# Patient Record
Sex: Female | Born: 1965 | Race: White | Hispanic: No | Marital: Married | Smoking: Never smoker
Health system: Southern US, Community
[De-identification: ages and names within clinical notes are randomized; demographics above are authoritative.]

## PROBLEM LIST (undated history)

## (undated) DIAGNOSIS — R55 Syncope and collapse: Secondary | ICD-10-CM

## (undated) DIAGNOSIS — I2510 Atherosclerotic heart disease of native coronary artery without angina pectoris: Secondary | ICD-10-CM

## (undated) DIAGNOSIS — R001 Bradycardia, unspecified: Secondary | ICD-10-CM

## (undated) DIAGNOSIS — H9319 Tinnitus, unspecified ear: Secondary | ICD-10-CM

## (undated) HISTORY — DX: Bradycardia, unspecified: R00.1

## (undated) HISTORY — DX: Syncope and collapse: R55

## (undated) HISTORY — DX: Tinnitus, unspecified ear: H93.19

---

## 2004-05-18 ENCOUNTER — Other Ambulatory Visit: Admission: RE | Admit: 2004-05-18 | Discharge: 2004-05-18 | Payer: Self-pay | Admitting: Obstetrics and Gynecology

## 2004-09-21 ENCOUNTER — Ambulatory Visit (HOSPITAL_COMMUNITY): Admission: RE | Admit: 2004-09-21 | Discharge: 2004-09-21 | Payer: Self-pay | Admitting: Obstetrics and Gynecology

## 2011-03-26 ENCOUNTER — Encounter (HOSPITAL_BASED_OUTPATIENT_CLINIC_OR_DEPARTMENT_OTHER): Payer: Self-pay | Admitting: *Deleted

## 2011-03-26 ENCOUNTER — Emergency Department (HOSPITAL_BASED_OUTPATIENT_CLINIC_OR_DEPARTMENT_OTHER)
Admission: EM | Admit: 2011-03-26 | Discharge: 2011-03-26 | Disposition: A | Discharge status: Home / Self Care | Payer: PRIVATE HEALTH INSURANCE | Attending: Emergency Medicine | Admitting: Emergency Medicine

## 2011-03-26 DIAGNOSIS — M5430 Sciatica, unspecified side: Secondary | ICD-10-CM | POA: Insufficient documentation

## 2011-03-26 DIAGNOSIS — IMO0001 Reserved for inherently not codable concepts without codable children: Secondary | ICD-10-CM | POA: Insufficient documentation

## 2011-03-26 MED ORDER — KETOROLAC TROMETHAMINE 30 MG/ML IJ SOLN
30.0000 mg | Freq: Once | INTRAMUSCULAR | Status: AC
  Administered 2011-03-26: 30 mg via INTRAVENOUS
  Filled 2011-03-26: qty 1

## 2011-03-26 MED ORDER — DIAZEPAM 5 MG PO TABS
5.0000 mg | ORAL_TABLET | Freq: Once | ORAL | Status: AC
  Administered 2011-03-26: 5 mg via ORAL
  Filled 2011-03-26: qty 1

## 2011-03-26 MED ORDER — DIAZEPAM 5 MG PO TABS: 5.0000 mg | ORAL_TABLET | Freq: Two times a day (BID) | ORAL | Status: AC

## 2011-03-26 MED ORDER — METHYLPREDNISOLONE 4 MG PO KIT: PACK | ORAL | Status: AC

## 2011-03-26 MED ORDER — OXYCODONE-ACETAMINOPHEN 5-325 MG PO TABS: 2.0000 | ORAL_TABLET | ORAL | Status: AC | PRN

## 2011-03-26 MED ORDER — IBUPROFEN 800 MG PO TABS: 800.0000 mg | ORAL_TABLET | Freq: Three times a day (TID) | ORAL | Status: AC

## 2011-03-26 MED ORDER — HYDROMORPHONE HCL PF 2 MG/ML IJ SOLN
2.0000 mg | Freq: Once | INTRAMUSCULAR | Status: AC
  Administered 2011-03-26: 2 mg via INTRAMUSCULAR
  Filled 2011-03-26: qty 1

## 2011-03-26 NOTE — ED Provider Notes (Signed)
History     CSN: [id number]  Arrival date & time 03/26/11  4098   First MD Initiated Contact with Patient 03/26/11 0745      Chief Complaint  Patient presents with  . Back Pain    (Consider location/radiation/quality/duration/timing/severity/associated sxs/prior treatment) HPI Comments: Sudden onset R buttock pain 2 days ago while twisting from sitting position.  Pain radiates down R leg to knee.  Difficulty walking today.  Taking naproxen with mild relief.  No previous back problems.  No bowel or bladder incontinence.  No fever, vomiting, weakness, numbness.  No history of cancer.  Patient is a 46 y.o. female presenting with back pain. The history is provided by the patient.  Back Pain  This is a new problem. The current episode started more than 2 days ago. The problem occurs constantly. The problem has not changed since onset.The pain is associated with twisting. The pain is present in the sacro-iliac joint and gluteal region. The quality of the pain is described as shooting and aching. The pain radiates to the right thigh and right knee. The pain is moderate. The symptoms are aggravated by bending, twisting and certain positions. Associated symptoms include numbness and tingling. Pertinent negatives include no chest pain, no fever, no headaches, no abdominal pain, no bowel incontinence, no perianal numbness, no bladder incontinence, no dysuria and no weakness. She has tried NSAIDs and heat for the symptoms. The treatment provided mild relief.    History reviewed. No pertinent past medical history.  History reviewed. No pertinent past surgical history.  History reviewed. No pertinent family history.  History  Substance Use Topics  . Smoking status: Never Smoker   . Smokeless tobacco: Not on file  . Alcohol Use:     OB History    Grav Para Term Preterm Abortions TAB SAB Ect Mult Living                  Review of Systems  Constitutional: Positive for activity change.  Negative for fever.  HENT: Negative for congestion and rhinorrhea.   Respiratory: Negative for cough and shortness of breath.   Cardiovascular: Negative for chest pain.  Gastrointestinal: Negative for nausea, vomiting, abdominal pain and bowel incontinence.  Genitourinary: Negative for bladder incontinence, dysuria, hematuria, vaginal bleeding and vaginal discharge.  Musculoskeletal: Positive for back pain and gait problem.  Skin: Negative for pallor.  Neurological: Positive for tingling and numbness. Negative for dizziness, weakness and headaches.    Allergies  Review of patient's allergies indicates no known allergies.  Home Medications  No current outpatient prescriptions on file.  BP 107/79  Pulse 91  Temp(Src) 97.6 F (36.4 C) (Oral)  Resp 20  Ht 5\' 4"  (1.626 m)  Wt 150 lb (68.04 kg)  BMI 25.75 kg/m2  SpO2 100%  LMP 03/17/2011  Physical Exam  Constitutional: She is oriented to person, place, and time. She appears well-developed and well-nourished. No distress.  HENT:  Head: Normocephalic and atraumatic.  Mouth/Throat: Oropharynx is clear and moist. No oropharyngeal exudate.  Eyes: Conjunctivae and EOM are normal. Pupils are equal, round, and reactive to light.  Neck: Normal range of motion. Neck supple.  Cardiovascular: Normal rate, regular rhythm and normal heart sounds.   Pulmonary/Chest: Effort normal and breath sounds normal. No respiratory distress.  Abdominal: Soft. There is no tenderness. There is no rebound and no guarding.  Musculoskeletal: Normal range of motion.       5/5 strength in bilateral LE.  Ankle plantar and dorsiflexion intact.  Able to extend great toes bilaterally.  +2 DP and PT pulses.  +2 patellar reflexes bilaterally.  +SLR bilaterally  Neurological: She is alert and oriented to person, place, and time. No cranial nerve deficit.  Skin: Skin is warm.    ED Course  Procedures (including critical care time)  Labs Reviewed - No data to  display No results found.   No diagnosis found.    MDM  Low back pain with radiation into R leg, consistent with sciatica. Neurologically intact, no red flags. Pain control, outpatient followup.   Pain controlled.  Patient able to ambulate in the department.      Glynn Octave, MD 03/26/11 [phone]

## 2011-03-26 NOTE — ED Notes (Signed)
Pt to room 8 by ems via stretcher. Pt moved from stretcher to bed x 3 assist. Pt reports "moved wrong way" Saturday, and has had low back right sided hip and leg pain since then.

## 2011-03-26 NOTE — ED Notes (Signed)
Pt amb to bathroom with slow, steady gait in nad. Cg@ provided by this rn and husband.

## 2011-03-26 NOTE — Discharge Instructions (Signed)
Back Exercises Back exercises help treat and prevent back injuries. The goal is to increase your strength in your belly (abdominal) and back muscles. These exercises can also help with flexibility. Start these exercises when told by your doctor. HOME CARE Back exercises include: Pelvic Tilt.  Lie on your back with your knees bent. Tilt your pelvis until the lower part of your back is against the floor. Hold this position 5 to 10 sec. Repeat this exercise 5 to 10 times.  Knee to Chest.  Pull 1 knee up against your chest and hold for 20 to 30 seconds. Repeat this with the other knee. This may be done with the other leg straight or bent, whichever feels better. Then, pull both knees up against your chest.  Sit-Ups or Curl-Ups.  Bend your knees 90 degrees. Start with tilting your pelvis, and do a partial, slow sit-up. Only lift your upper half 30 to 45 degrees off the floor. Take at least 2 to 3 seonds for each sit-up. Do not do sit-ups with your knees out straight. If partial sit-ups are difficult, simply do the above but with only tightening your belly (abdominal) muscles and holding it as told.  Hip-Lift.  Lie on your back with your knees flexed 90 degrees. Push down with your feet and shoulders as you raise your hips 2 inches off the floor. Hold for 10 seconds, repeat 5 to 10 times.  Back Arches.  Lie on your stomach. Prop yourself up on bent elbows. Slowly press on your hands, causing an arch in your low back. Repeat 3 to 5 times.  Shoulder-Lifts.  Lie face down with arms beside your body. Keep hips and belly pressed to floor as you slowly lift your head and shoulders off the floor.  Do not overdo your exercises. Be careful in the beginning. Exercises may cause you some mild back discomfort. If the pain lasts for more than 15 minutes, stop the exercises until you see your doctor. Improvement with exercise for back problems is slow.  Document Released: 02/17/2010 Document Revised: 09/27/2010  Document Reviewed: 02/17/2010 [employer] Patient Information 2012 [employer].Sciatica Sciatica is a weakness and/or changes in sensation (tingling, jolts, hot and cold, numbness) along the path the sciatic nerve travels. Irritation or damage to lumbar nerve roots is often also referred to as lumbar radiculopathy.  Lumbar radiculopathy (Sciatica) is the most common form of this problem. Radiculopathy can occur in any of the nerves coming out of the spinal cord. The problems caused depend on which nerves are involved. The sciatic nerve is the large nerve supplying the branches of nerves going from the hip to the toes. It often causes a numbness or weakness in the skin and/or muscles that the sciatic nerve serves. It also may cause symptoms (problems) of pain, burning, tingling, or electric shock-like feelings in the path of this nerve. This usually comes from injury to the fibers that make up the sciatic nerve. Some of these symptoms are low back pain and/or unpleasant feelings in the following areas:  From the mid-buttock down the back of the leg to the back of the knee.   And/or the outside of the calf and top of the foot.   And/or behind the inner ankle to the sole of the foot.  CAUSES   Herniated or slipped disc. Discs are the little cushions between the bones in the back.   Pressure by the piriformis muscle in the buttock on the sciatic nerve (Piriformis Syndrome).   Misalignment   of the bones in the lower back and buttocks (Sacroiliac Joint Derangement).   Narrowing of the spinal canal that puts pressure on or pinches the fibers that make up the sciatic nerve.   A slipped vertebra that is out of line with those above or beneath it.   Abnormality of the nervous system itself so that nerve fibers do not transmit signals properly, especially to feet and calves (neuropathy).   Tumor (this is rare).  Your caregiver can usually determine the cause of your sciatica and begin the treatment  most likely to help you. TREATMENT  Taking over-the-counter painkillers, physical therapy, rest, exercise, spinal manipulation, and injections of anesthetics and/or steroids may be used. Surgery, acupuncture, and Yoga can also be effective. Mind over matter techniques, mental imagery, and changing factors such as your bed, chair, desk height, posture, and activities are other treatments that may be helpful. You and your caregiver can help determine what is best for you. With proper diagnosis, the cause of most sciatica can be identified and removed. Communication and cooperation between your caregiver and you is essential. If you are not successful immediately, do not be discouraged. With time, a proper treatment can be found that will make you comfortable. HOME CARE INSTRUCTIONS   If the pain is coming from a problem in the back, applying ice to that area for 15 to 20 minutes, 3 to 4 times per day while awake, may be helpful. Put the ice in a plastic bag. Place a towel between the bag of ice and your skin.   You may exercise or perform your usual activities if these do not aggravate your pain, or as suggested by your caregiver.   Only take over-the-counter or prescription medicines for pain, discomfort, or fever as directed by your caregiver.   If your caregiver has given you a follow-up appointment, it is very important to keep that appointment. Not keeping the appointment could result in a chronic or permanent injury, pain, and disability. If there is any problem keeping the appointment, you must call back to this facility for assistance.  SEEK IMMEDIATE MEDICAL CARE IF:   You experience loss of control of bowel or bladder.   You have increasing weakness in the trunk, buttocks, or legs.   There is numbness in any areas from the hip down to the toes.   You have difficulty walking or keeping your balance.   You have any of the above, with fever or forceful vomiting.  Document Released:  01/09/2001 Document Revised: 09/27/2010 Document Reviewed: 08/29/2007 [employer] Patient Information 2012 [employer]. 

## 2012-06-09 ENCOUNTER — Other Ambulatory Visit (HOSPITAL_COMMUNITY): Payer: Self-pay | Admitting: Obstetrics

## 2012-06-09 DIAGNOSIS — Z1231 Encounter for screening mammogram for malignant neoplasm of breast: Secondary | ICD-10-CM

## 2012-06-19 ENCOUNTER — Ambulatory Visit (HOSPITAL_COMMUNITY): Payer: PRIVATE HEALTH INSURANCE

## 2012-06-26 ENCOUNTER — Ambulatory Visit (HOSPITAL_COMMUNITY)
Admission: RE | Admit: 2012-06-26 | Discharge: 2012-06-26 | Disposition: A | Discharge status: Home / Self Care | Payer: Commercial Managed Care - PPO | Source: Ambulatory Visit | Attending: Obstetrics | Admitting: Obstetrics

## 2012-06-26 DIAGNOSIS — Z1231 Encounter for screening mammogram for malignant neoplasm of breast: Secondary | ICD-10-CM | POA: Insufficient documentation

## 2012-08-26 ENCOUNTER — Emergency Department (HOSPITAL_COMMUNITY): Payer: Commercial Managed Care - PPO

## 2012-08-26 ENCOUNTER — Encounter (HOSPITAL_COMMUNITY): Payer: Self-pay | Admitting: Emergency Medicine

## 2012-08-26 ENCOUNTER — Emergency Department (HOSPITAL_COMMUNITY)
Admission: EM | Admit: 2012-08-26 | Discharge: 2012-08-26 | Disposition: A | Discharge status: Home / Self Care | Payer: Commercial Managed Care - PPO | Attending: Emergency Medicine | Admitting: Emergency Medicine

## 2012-08-26 DIAGNOSIS — R51 Headache: Secondary | ICD-10-CM | POA: Insufficient documentation

## 2012-08-26 DIAGNOSIS — I2510 Atherosclerotic heart disease of native coronary artery without angina pectoris: Secondary | ICD-10-CM | POA: Insufficient documentation

## 2012-08-26 DIAGNOSIS — R1110 Vomiting, unspecified: Secondary | ICD-10-CM | POA: Insufficient documentation

## 2012-08-26 DIAGNOSIS — R209 Unspecified disturbances of skin sensation: Secondary | ICD-10-CM | POA: Insufficient documentation

## 2012-08-26 DIAGNOSIS — R0789 Other chest pain: Secondary | ICD-10-CM

## 2012-08-26 DIAGNOSIS — R42 Dizziness and giddiness: Secondary | ICD-10-CM | POA: Insufficient documentation

## 2012-08-26 HISTORY — DX: Atherosclerotic heart disease of native coronary artery without angina pectoris: I25.10

## 2012-08-26 LAB — COMPREHENSIVE METABOLIC PANEL
Albumin: 4.1 g/dL (ref 3.5–5.2)
Alkaline Phosphatase: 88 U/L (ref 39–117)
BUN: 15 mg/dL (ref 6–23)
CO2: 23 mEq/L (ref 19–32)
Chloride: 101 mEq/L (ref 96–112)
Creatinine, Ser: 0.81 mg/dL (ref 0.50–1.10)
GFR calc non Af Amer: 86 mL/min — ABNORMAL LOW (ref >90)
Glucose, Bld: 89 mg/dL (ref 70–99)
Potassium: 3.8 mEq/L (ref 3.5–5.1)
Total Bilirubin: 0.5 mg/dL (ref 0.3–1.2)

## 2012-08-26 LAB — CBC WITH DIFFERENTIAL/PLATELET
Basophils Absolute: 0 10*3/uL (ref 0.0–0.1)
Eosinophils Absolute: 0 10*3/uL (ref 0.0–0.7)
Eosinophils Relative: 0 % (ref 0–5)
Lymphocytes Relative: 30 % (ref 12–46)
MCV: 87.5 fL (ref 78.0–100.0)
Neutrophils Relative %: 64 % (ref 43–77)
Platelets: 247 10*3/uL (ref 150–400)
RDW: 12.5 % (ref 11.5–15.5)
WBC: 7.8 10*3/uL (ref 4.0–10.5)

## 2012-08-26 LAB — POCT I-STAT TROPONIN I: Troponin i, poc: 0 ng/mL (ref 0.00–0.08)

## 2012-08-26 NOTE — ED Notes (Signed)
Cp started at 8 am tingling inleft at 9 am dull and pain increasing w/ h/a andsob nausea has hx neurocardiagenic syncopy has been having dizzy spell thought it was stress

## 2012-08-26 NOTE — ED Provider Notes (Addendum)
CSN: [id number]     Arrival date & time 08/26/12  1209 History     First MD Initiated Contact with Patient 08/26/12 1320     Chief Complaint  Patient presents with  . Chest Pain   (Consider location/radiation/quality/duration/timing/severity/associated sxs/prior Treatment) HPI Patient awakened at 6 AM today with anterior chest pain L. in nature, constant, with tingling in left arm. And headache. Accompanied by vomiting 1 episode. And lightheadedness No treatment prior to coming here. Discomfort is minimal at present. Nothing makes symptoms better or worse. She's had chest pain multiple times in the past but "never this bad" Past Medical History  Diagnosis Date  . Coronary artery disease     neurocardiogenic syncopy   no known history of coronary artery disease patient has no history of catheterization. Had stress test 12 years ago and suffered syncope during stress test. Cardiac risk factors father determined to have coronary artery disease in his 79s. No family history of early MI other risk factors negative No past surgical history on file. No family history on file. History  Substance Use Topics  . Smoking status: Never Smoker   . Smokeless tobacco: Not on file  . Alcohol Use: Yes   no illicit drug use OB History   Grav Para Term Preterm Abortions TAB SAB Ect Mult Living                 Review of Systems  Constitutional: Negative.   Respiratory: Negative.   Cardiovascular: Positive for chest pain.  Gastrointestinal: Negative.   Musculoskeletal: Negative.   Skin: Negative.   Neurological: Positive for light-headedness and headaches.  Psychiatric/Behavioral: Negative.   All other systems reviewed and are negative.    Allergies  Review of patient's allergies indicates no known allergies.  Home Medications  No current outpatient prescriptions on file. BP 138/84  Pulse 80  Temp(Src) 97.7 F (36.5 C)  Resp 20  SpO2 100%  LMP 08/12/2012 Physical Exam  Nursing note  and vitals reviewed. Constitutional: She appears well-developed and well-nourished.  HENT:  Head: Normocephalic and atraumatic.  Eyes: Conjunctivae are normal. Pupils are equal, round, and reactive to light.  Neck: Neck supple. No tracheal deviation present. No thyromegaly present.  Cardiovascular: Normal rate and regular rhythm.   No murmur heard. Pulmonary/Chest: Effort normal and breath sounds normal.  Abdominal: Soft. Bowel sounds are normal. She exhibits no distension. There is no tenderness.  Musculoskeletal: Normal range of motion. She exhibits no edema and no tenderness.  Neurological: She is alert. Coordination normal.  Skin: Skin is warm and dry. No rash noted.  Psychiatric: She has a normal mood and affect.    ED Course   Procedures (including critical care time)  Labs Reviewed  COMPREHENSIVE METABOLIC PANEL - Abnormal; Notable for the following:    GFR calc non Af Amer 86 (*)    All other components within normal limits  CBC WITH DIFFERENTIAL - Abnormal; Notable for the following:    RBC 5.36 (*)    Hemoglobin 16.0 (*)    HCT 46.9 (*)    All other components within normal limits  URINALYSIS, ROUTINE W REFLEX MICROSCOPIC  POCT I-STAT TROPONIN I   Dg Chest 2 View  08/26/2012   *RADIOLOGY REPORT*  Clinical Data: Chest pain, headache, difficulty breathing  CHEST - 2 VIEW  Comparison: None.  Findings: Cardiomediastinal silhouette is unremarkable.  No acute infiltrate or pleural effusion.  No pulmonary edema.  Bony thorax is unremarkable.  IMPRESSION: No active disease.  Original Report Authenticated By: Natasha Mead, M.D.   No diagnosis found.  Date: 08/26/2012  Rate: 70  Rhythm: normal sinus rhythm  QRS Axis: normal  Intervals: normal  ST/T Wave abnormalities: normal  Conduction Disutrbances:Rightward IVCD  Narrative Interpretation:   Old EKG Reviewed: none available Chest x-ray viewed by me Results for orders placed during the hospital encounter of 08/26/12   COMPREHENSIVE METABOLIC PANEL      Result Value Range   Sodium 138  135 - 145 mEq/L   Potassium 3.8  3.5 - 5.1 mEq/L   Chloride 101  96 - 112 mEq/L   CO2 23  19 - 32 mEq/L   Glucose, Bld 89  70 - 99 mg/dL   BUN 15  6 - 23 mg/dL   Creatinine, Ser 7.82  0.50 - 1.10 mg/dL   Calcium 95.6  8.4 - 21.3 mg/dL   Total Protein 7.2  6.0 - 8.3 g/dL   Albumin 4.1  3.5 - 5.2 g/dL   AST 22  0 - 37 U/L   ALT 14  0 - 35 U/L   Alkaline Phosphatase 88  39 - 117 U/L   Total Bilirubin 0.5  0.3 - 1.2 mg/dL   GFR calc non Af Amer 86 (*) >90 mL/min   GFR calc Af Amer >90  >90 mL/min  CBC WITH DIFFERENTIAL      Result Value Range   WBC 7.8  4.0 - 10.5 K/uL   RBC 5.36 (*) 3.87 - 5.11 MIL/uL   Hemoglobin 16.0 (*) 12.0 - 15.0 g/dL   HCT 08.6 (*) 57.8 - 46.9 %   MCV 87.5  78.0 - 100.0 fL   MCH 29.9  26.0 - 34.0 pg   MCHC 34.1  30.0 - 36.0 g/dL   RDW 62.9  52.8 - 41.3 %   Platelets 247  150 - 400 K/uL   Neutrophils Relative % 64  43 - 77 %   Neutro Abs 5.0  1.7 - 7.7 K/uL   Lymphocytes Relative 30  12 - 46 %   Lymphs Abs 2.3  0.7 - 4.0 K/uL   Monocytes Relative 6  3 - 12 %   Monocytes Absolute 0.4  0.1 - 1.0 K/uL   Eosinophils Relative 0  0 - 5 %   Eosinophils Absolute 0.0  0.0 - 0.7 K/uL   Basophils Relative 0  0 - 1 %   Basophils Absolute 0.0  0.0 - 0.1 K/uL  POCT I-STAT TROPONIN I      Result Value Range   Troponin i, poc 0.00  0.00 - 0.08 ng/mL   Comment 3            Dg Chest 2 View  08/26/2012   *RADIOLOGY REPORT*  Clinical Data: Chest pain, headache, difficulty breathing  CHEST - 2 VIEW  Comparison: None.  Findings: Cardiomediastinal silhouette is unremarkable.  No acute infiltrate or pleural effusion.  No pulmonary edema.  Bony thorax is unremarkable.  IMPRESSION: No active disease.   Original Report Authenticated By: Natasha Mead, M.D.    2:30 PM patient continues to feel well. MDM  Case discussed with Dr.Varanasi, in light of atypical symptoms, essentially no cardiac risk factors, near  normal EKG and negative troponin after several hours of chest pain doubt acute coronary syndrome. Plan office visit this week Diagnoses atypical chest pain    Doug Sou, MD 08/26/12 1439  Doug Sou, MD 08/26/12 1439

## 2013-05-19 ENCOUNTER — Other Ambulatory Visit (HOSPITAL_COMMUNITY): Payer: Self-pay | Admitting: Obstetrics

## 2013-05-19 DIAGNOSIS — Z1231 Encounter for screening mammogram for malignant neoplasm of breast: Secondary | ICD-10-CM

## 2013-06-30 ENCOUNTER — Ambulatory Visit (HOSPITAL_COMMUNITY)
Admission: RE | Admit: 2013-06-30 | Discharge: 2013-06-30 | Disposition: A | Discharge status: Home / Self Care | Payer: Commercial Managed Care - PPO | Source: Ambulatory Visit | Attending: Obstetrics | Admitting: Obstetrics

## 2013-06-30 DIAGNOSIS — Z1231 Encounter for screening mammogram for malignant neoplasm of breast: Secondary | ICD-10-CM | POA: Insufficient documentation

## 2013-07-02 ENCOUNTER — Other Ambulatory Visit: Payer: Self-pay | Admitting: Obstetrics

## 2013-07-02 DIAGNOSIS — R928 Other abnormal and inconclusive findings on diagnostic imaging of breast: Secondary | ICD-10-CM

## 2013-07-08 ENCOUNTER — Ambulatory Visit
Admission: RE | Admit: 2013-07-08 | Discharge: 2013-07-08 | Disposition: A | Discharge status: Home / Self Care | Payer: Commercial Managed Care - PPO | Source: Ambulatory Visit | Attending: Obstetrics | Admitting: Obstetrics

## 2013-07-08 DIAGNOSIS — R928 Other abnormal and inconclusive findings on diagnostic imaging of breast: Secondary | ICD-10-CM

## 2013-09-03 ENCOUNTER — Ambulatory Visit: Payer: PRIVATE HEALTH INSURANCE | Admitting: Cardiology

## 2013-09-17 ENCOUNTER — Ambulatory Visit: Payer: PRIVATE HEALTH INSURANCE | Admitting: Cardiology

## 2013-09-25 ENCOUNTER — Encounter: Payer: Self-pay | Admitting: General Surgery

## 2013-09-25 DIAGNOSIS — G904 Autonomic dysreflexia: Secondary | ICD-10-CM | POA: Insufficient documentation

## 2013-09-25 DIAGNOSIS — R42 Dizziness and giddiness: Secondary | ICD-10-CM | POA: Insufficient documentation

## 2013-09-25 DIAGNOSIS — R55 Syncope and collapse: Secondary | ICD-10-CM

## 2013-10-16 ENCOUNTER — Ambulatory Visit: Payer: Commercial Managed Care - PPO | Admitting: Cardiology

## 2013-11-02 ENCOUNTER — Encounter: Payer: Self-pay | Admitting: Interventional Cardiology

## 2013-11-02 ENCOUNTER — Encounter: Payer: Self-pay | Admitting: Cardiology

## 2013-12-06 ENCOUNTER — Encounter: Payer: Self-pay | Admitting: *Deleted

## 2014-10-06 ENCOUNTER — Other Ambulatory Visit (HOSPITAL_COMMUNITY): Payer: Self-pay | Admitting: Obstetrics

## 2014-10-06 DIAGNOSIS — Z1231 Encounter for screening mammogram for malignant neoplasm of breast: Secondary | ICD-10-CM

## 2014-10-08 ENCOUNTER — Ambulatory Visit (HOSPITAL_COMMUNITY)
Admission: RE | Admit: 2014-10-08 | Discharge: 2014-10-08 | Disposition: A | Discharge status: Home / Self Care | Payer: Commercial Managed Care - PPO | Source: Ambulatory Visit | Attending: Obstetrics | Admitting: Obstetrics

## 2014-10-08 DIAGNOSIS — Z1231 Encounter for screening mammogram for malignant neoplasm of breast: Secondary | ICD-10-CM | POA: Diagnosis not present

## 2017-03-11 DIAGNOSIS — M238X1 Other internal derangements of right knee: Secondary | ICD-10-CM | POA: Diagnosis not present

## 2017-03-11 DIAGNOSIS — M545 Low back pain: Secondary | ICD-10-CM | POA: Diagnosis not present

## 2017-03-20 DIAGNOSIS — M67461 Ganglion, right knee: Secondary | ICD-10-CM | POA: Diagnosis not present

## 2017-04-03 DIAGNOSIS — L720 Epidermal cyst: Secondary | ICD-10-CM | POA: Diagnosis not present

## 2017-09-03 DIAGNOSIS — N644 Mastodynia: Secondary | ICD-10-CM | POA: Diagnosis not present

## 2017-09-03 DIAGNOSIS — Z1231 Encounter for screening mammogram for malignant neoplasm of breast: Secondary | ICD-10-CM | POA: Diagnosis not present

## 2017-09-23 DIAGNOSIS — L309 Dermatitis, unspecified: Secondary | ICD-10-CM | POA: Diagnosis not present

## 2017-10-14 DIAGNOSIS — Z0000 Encounter for general adult medical examination without abnormal findings: Secondary | ICD-10-CM | POA: Diagnosis not present

## 2017-10-14 DIAGNOSIS — Z136 Encounter for screening for cardiovascular disorders: Secondary | ICD-10-CM | POA: Diagnosis not present

## 2017-10-14 DIAGNOSIS — Z833 Family history of diabetes mellitus: Secondary | ICD-10-CM | POA: Diagnosis not present

## 2017-11-11 DIAGNOSIS — Z23 Encounter for immunization: Secondary | ICD-10-CM | POA: Diagnosis not present

## 2017-12-19 ENCOUNTER — Other Ambulatory Visit: Payer: Self-pay | Admitting: Family Medicine

## 2017-12-19 DIAGNOSIS — M7989 Other specified soft tissue disorders: Secondary | ICD-10-CM

## 2017-12-25 ENCOUNTER — Other Ambulatory Visit: Payer: Self-pay | Admitting: Family Medicine

## 2017-12-25 ENCOUNTER — Ambulatory Visit
Admission: RE | Admit: 2017-12-25 | Discharge: 2017-12-25 | Disposition: A | Discharge status: Home / Self Care | Payer: 59 | Source: Ambulatory Visit | Attending: Family Medicine | Admitting: Family Medicine

## 2017-12-25 ENCOUNTER — Ambulatory Visit
Admission: RE | Admit: 2017-12-25 | Discharge: 2017-12-25 | Disposition: A | Discharge status: Home / Self Care | Payer: Commercial Managed Care - PPO | Source: Ambulatory Visit | Attending: Family Medicine | Admitting: Family Medicine

## 2017-12-25 DIAGNOSIS — M7989 Other specified soft tissue disorders: Secondary | ICD-10-CM

## 2017-12-25 DIAGNOSIS — N6489 Other specified disorders of breast: Secondary | ICD-10-CM | POA: Diagnosis not present

## 2017-12-25 DIAGNOSIS — R928 Other abnormal and inconclusive findings on diagnostic imaging of breast: Secondary | ICD-10-CM | POA: Diagnosis not present

## 2019-02-12 ENCOUNTER — Other Ambulatory Visit: Payer: Self-pay | Admitting: Specialist

## 2021-03-29 ENCOUNTER — Emergency Department (HOSPITAL_BASED_OUTPATIENT_CLINIC_OR_DEPARTMENT_OTHER)
Admission: EM | Admit: 2021-03-29 | Discharge: 2021-03-29 | Disposition: A | Discharge status: Home / Self Care | Payer: BC Managed Care – PPO | Attending: Emergency Medicine | Admitting: Emergency Medicine

## 2021-03-29 ENCOUNTER — Emergency Department (HOSPITAL_BASED_OUTPATIENT_CLINIC_OR_DEPARTMENT_OTHER): Payer: BC Managed Care – PPO

## 2021-03-29 ENCOUNTER — Encounter (HOSPITAL_BASED_OUTPATIENT_CLINIC_OR_DEPARTMENT_OTHER): Payer: Self-pay | Admitting: Urology

## 2021-03-29 ENCOUNTER — Other Ambulatory Visit: Payer: Self-pay

## 2021-03-29 DIAGNOSIS — I2510 Atherosclerotic heart disease of native coronary artery without angina pectoris: Secondary | ICD-10-CM | POA: Insufficient documentation

## 2021-03-29 DIAGNOSIS — S20219A Contusion of unspecified front wall of thorax, initial encounter: Secondary | ICD-10-CM | POA: Diagnosis not present

## 2021-03-29 DIAGNOSIS — S299XXA Unspecified injury of thorax, initial encounter: Secondary | ICD-10-CM | POA: Diagnosis present

## 2021-03-29 DIAGNOSIS — S90512A Abrasion, left ankle, initial encounter: Secondary | ICD-10-CM | POA: Diagnosis not present

## 2021-03-29 DIAGNOSIS — Y92410 Unspecified street and highway as the place of occurrence of the external cause: Secondary | ICD-10-CM | POA: Diagnosis not present

## 2021-03-29 MED ORDER — LIDOCAINE 5 % EX PTCH: 1.0000 | MEDICATED_PATCH | Freq: Every day | CUTANEOUS | 0 refills | Status: AC | PRN

## 2021-03-29 MED ORDER — METHOCARBAMOL 500 MG PO TABS: 1000.0000 mg | ORAL_TABLET | Freq: Two times a day (BID) | ORAL | 0 refills | Status: AC

## 2021-03-29 MED ORDER — HYDROCODONE-ACETAMINOPHEN 5-325 MG PO TABS
1.0000 | ORAL_TABLET | Freq: Once | ORAL | Status: AC
  Administered 2021-03-29: 1 via ORAL
  Filled 2021-03-29: qty 1

## 2021-03-29 MED ORDER — IBUPROFEN 600 MG PO TABS: 600.0000 mg | ORAL_TABLET | Freq: Four times a day (QID) | ORAL | 0 refills | Status: AC | PRN

## 2021-03-29 MED ORDER — ACETAMINOPHEN 325 MG PO TABS: 650.0000 mg | ORAL_TABLET | Freq: Four times a day (QID) | ORAL | 0 refills | Status: AC | PRN
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
## 2021-03-29 NOTE — Discharge Instructions (Signed)
It was a pleasure caring for you today in the emergency department. ° °Please return to the emergency department for any worsening or worrisome symptoms. ° ° °
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
## 2021-07-14 ENCOUNTER — Other Ambulatory Visit: Payer: Self-pay | Admitting: Sports Medicine

## 2021-07-14 ENCOUNTER — Ambulatory Visit
Admission: RE | Admit: 2021-07-14 | Discharge: 2021-07-14 | Disposition: A | Discharge status: Home / Self Care | Payer: BC Managed Care – PPO | Source: Ambulatory Visit | Attending: Sports Medicine | Admitting: Sports Medicine

## 2021-07-14 DIAGNOSIS — M25511 Pain in right shoulder: Secondary | ICD-10-CM
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
